# Patient Record
Sex: Male | Born: 1965
Health system: Southern US, Community
[De-identification: ages and names within clinical notes are randomized; demographics above are authoritative.]

## PROBLEM LIST (undated history)

## (undated) DIAGNOSIS — I219 Acute myocardial infarction, unspecified: Secondary | ICD-10-CM

## (undated) HISTORY — DX: Acute myocardial infarction, unspecified: I21.9

---

## 2007-02-23 DIAGNOSIS — I219 Acute myocardial infarction, unspecified: Secondary | ICD-10-CM

## 2007-02-23 HISTORY — DX: Acute myocardial infarction, unspecified: I21.9

## 2010-07-05 ENCOUNTER — Encounter: Admission: RE | Admit: 2010-07-05 | Discharge: 2010-07-05 | Payer: Self-pay | Admitting: Sports Medicine

## 2011-03-04 ENCOUNTER — Other Ambulatory Visit: Payer: Self-pay | Admitting: Specialist

## 2011-03-04 DIAGNOSIS — S43439A Superior glenoid labrum lesion of unspecified shoulder, initial encounter: Secondary | ICD-10-CM

## 2011-03-14 ENCOUNTER — Ambulatory Visit
Admission: RE | Admit: 2011-03-14 | Discharge: 2011-03-14 | Disposition: A | Discharge status: Home / Self Care | Payer: BC Managed Care – PPO | Source: Ambulatory Visit | Attending: Specialist | Admitting: Specialist

## 2011-03-14 DIAGNOSIS — S43439A Superior glenoid labrum lesion of unspecified shoulder, initial encounter: Secondary | ICD-10-CM

## 2012-08-12 IMAGING — RF DG FLUORO GUIDE NDL PLC/BX
2 series · 2 of 2 positions shown · non-contrast
Comparison: None.

CLINICAL DATA: Right shoulder pain

FLUOROSCOPICALLY GUIDED NEEDLE PLACEMENT FOR MR ARTHROGRAPHY, RIGHT
SHOULDER

[Series 1: (hospital) · 1 of 1 slices shown]
[im 1/1]
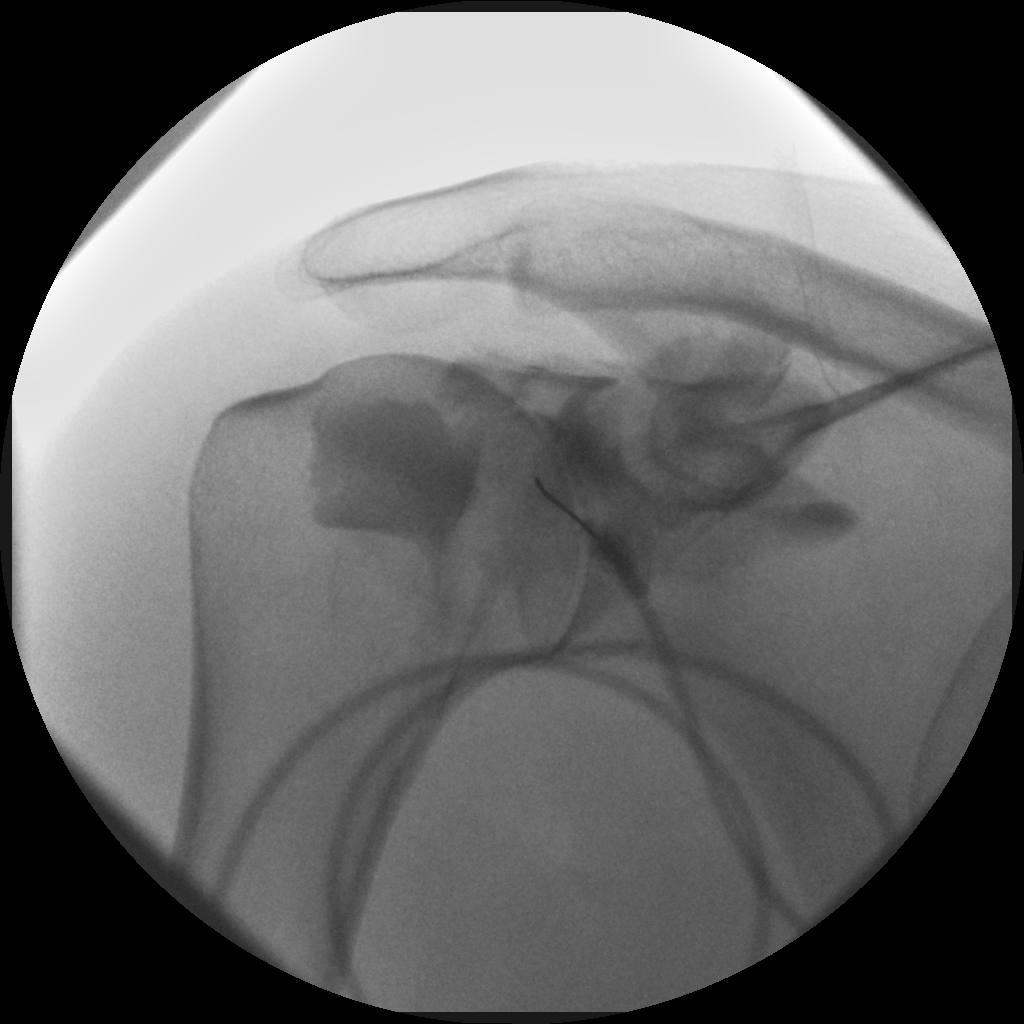

[Series 2: arthrogram · 1 of 1 slices shown]
[im 1/1]
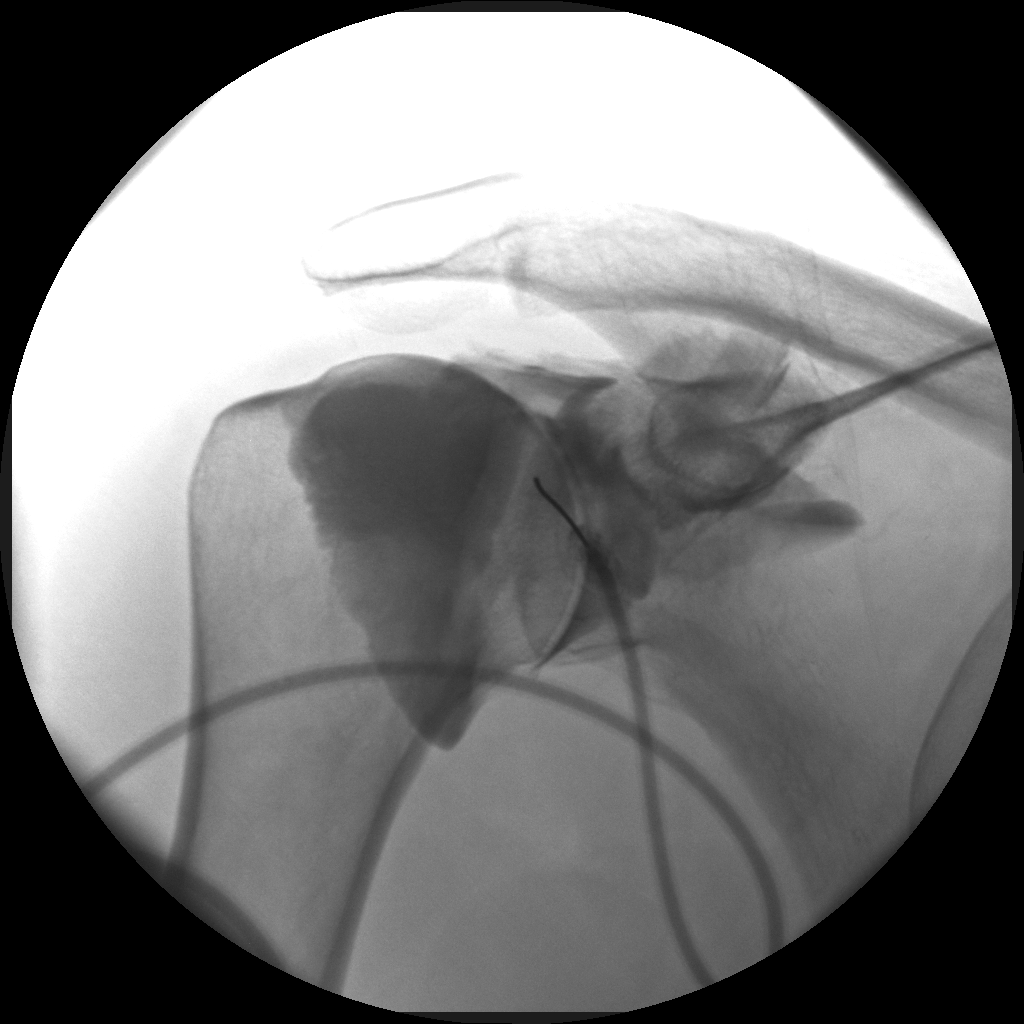

[2 of 2 positions shown; findings below may reference images not displayed]

FINDINGS: Informed, written consent, including pain, infection, and
bleeding, was obtained from the patient before proceeding.  A
thorough Betadine scrub was performed.  An appropriate needle entry
point was chosen, and the skin and subcutaneous tissues were
infiltrated with 1% lidocaine.

A 22 gauge spinal needle was placed directly on the superomedial
aspect of the humeral head, and entry into the glenohumeral joint
was confirmed using "loss of resistance" technique, with additional
lidocaine.

A [DATE] dilution of Gadolinium (0.1 mL MultiHance), mixed with 5 ml
of saline, 10 ml of Karlios, and 5 ml of 1% lidocaine, was injected
into the glenohumeral joint under fluoroscopic guidance.  There was
good opacification of the joint.

The study was performed not as a diagnostic arthrogram, but as a
prelude to MR arthrography.

The needle was withdrawn, a sterile bandage was applied, and the
patient was escorted to the MR suite.  There were no immediate
complications.
IMPRESSION: Technically successful glenohumeral joint instillation of dilute
MultiHance as described above for MR arthrography, right shoulder.

## 2013-11-24 ENCOUNTER — Other Ambulatory Visit: Payer: Self-pay | Admitting: *Deleted

## 2013-11-24 DIAGNOSIS — E7800 Pure hypercholesterolemia, unspecified: Secondary | ICD-10-CM

## 2013-11-24 DIAGNOSIS — Z79899 Other long term (current) drug therapy: Secondary | ICD-10-CM

## 2013-12-14 ENCOUNTER — Other Ambulatory Visit (INDEPENDENT_AMBULATORY_CARE_PROVIDER_SITE_OTHER): Payer: 59

## 2013-12-14 ENCOUNTER — Encounter: Payer: Self-pay | Admitting: *Deleted

## 2013-12-14 DIAGNOSIS — E7800 Pure hypercholesterolemia, unspecified: Secondary | ICD-10-CM

## 2013-12-14 DIAGNOSIS — Z79899 Other long term (current) drug therapy: Secondary | ICD-10-CM

## 2013-12-14 LAB — HEPATIC FUNCTION PANEL
ALT: 23 U/L (ref 0–53)
AST: 23 U/L (ref 0–37)
Albumin: 4.2 g/dL (ref 3.5–5.2)
Alkaline Phosphatase: 58 U/L (ref 39–117)
Bilirubin, Direct: 0.2 mg/dL (ref 0.0–0.3)
TOTAL PROTEIN: 7.3 g/dL (ref 6.0–8.3)
Total Bilirubin: 1.5 mg/dL — ABNORMAL HIGH (ref 0.3–1.2)

## 2013-12-14 LAB — LIPID PANEL
CHOL/HDL RATIO: 3
Cholesterol: 112 mg/dL (ref 0–200)
HDL: 39.4 mg/dL (ref >39.00)
LDL Cholesterol: 63 mg/dL (ref 0–99)
TRIGLYCERIDES: 49 mg/dL (ref 0.0–149.0)
VLDL: 9.8 mg/dL (ref 0.0–40.0)

## 2013-12-15 ENCOUNTER — Other Ambulatory Visit: Payer: BC Managed Care – PPO

## 2013-12-16 ENCOUNTER — Encounter: Payer: Self-pay | Admitting: Cardiology

## 2013-12-16 ENCOUNTER — Ambulatory Visit (INDEPENDENT_AMBULATORY_CARE_PROVIDER_SITE_OTHER): Payer: 59 | Admitting: Cardiology

## 2013-12-16 ENCOUNTER — Ambulatory Visit: Payer: BC Managed Care – PPO | Admitting: Cardiology

## 2013-12-16 VITALS — BP 130/85 | HR 70 | Ht 70.0 in | Wt 226.0 lb

## 2013-12-16 DIAGNOSIS — E7800 Pure hypercholesterolemia, unspecified: Secondary | ICD-10-CM

## 2013-12-16 DIAGNOSIS — I252 Old myocardial infarction: Secondary | ICD-10-CM

## 2013-12-16 DIAGNOSIS — I2510 Atherosclerotic heart disease of native coronary artery without angina pectoris: Secondary | ICD-10-CM | POA: Insufficient documentation

## 2013-12-16 MED ORDER — ATORVASTATIN CALCIUM 20 MG PO TABS: 20.0000 mg | ORAL_TABLET | Freq: Every day | ORAL | Status: DC

## 2013-12-16 NOTE — Progress Notes (Signed)
      [address] [place], [place]  [address] Phone: [phone] Fax:  [phone]  Date:  12/16/2013   ID:  Andre DingwallRobert Wyatt, DOB [birth year], MRN [id number]  PCP:  No primary provider on file.   History of Present Illness: Andre DingwallRobert Boccio is a 48 y.o. male with prior myocardial infarction of the lateral wall in 2008 with PTCA only, no stent placement here for annual followup. He is compliant with his medications, takes aspirin, low-dose beta blocker, Lipitor. Fish oil. His last LDL in January of 2014 was 60. His prior myocardial infarction felt like pressure in his chest, heartburn-like, this occurred during a workout. He took a shower, felt better than the pressure returned. He ended up driving to the hospital or a small obtuse marginal lesion was noted.   He has had a few times with sex feeling lightheaded. One time at the gym after cardio felt light headed. Slowed down and it went away.  Doing well. No CP. Joint issues with exercise. Overall doing well. LDL cholesterol excellent.    Wt Readings from Last 3 Encounters:  12/16/13 226 lb (102.513 kg)     Past Medical History  Diagnosis Date  . MI (myocardial infarction) 02/23/2007    lateral wall MI-[facility]-balloon angioplasty only to small distal obtuse marginal branch. No stent placed.    No past surgical history on file.0  Current Outpatient Prescriptions  Medication Sig Dispense Refill  . aspirin 81 MG tablet Take 81 mg by mouth daily.      Marland Kitchen. atorvastatin (LIPITOR) 20 MG tablet Take 20 mg by mouth daily.      . Multiple Vitamin (MULTIVITAMIN) capsule Take 1 capsule by mouth daily.      . nitroGLYCERIN (NITROSTAT) 0.4 MG SL tablet Place 0.4 mg under the tongue every 5 (five) minutes as needed for chest pain.      . Omega-3 Fatty Acids (FISH OIL) 1000 MG CAPS Take by mouth.       No current facility-administered medications for this visit.    Allergies:   No Known Allergies  Social History:  The patient   reports that he has never smoked. He does not have any smokeless tobacco history on file. He reports that he does not drink alcohol or use illicit drugs.   ROS:  Please see the history of present illness.   No angina, no shortness of breath, no syncope    PHYSICAL EXAM: VS:  BP 130/85  Pulse 70  Ht 5\' 10"  (1.778 m)  Wt 226 lb (102.513 kg)  BMI 32.43 kg/m2 Well nourished, well developed, in no acute distress HEENT: normal Neck: no JVD Cardiac:  normal S1, S2; RRR; no murmur Lungs:  clear to auscultation bilaterally, no wheezing, rhonchi or rales Abd: soft, nontender, no hepatomegaly Ext: no edema Skin: warm and dry Neuro: no focal abnormalities noted  EKG:  None today    ASSESSMENT AND PLAN:  1. Coronary artery disease-status post PTCA only to obtuse marginal branch in 2008 during infarction. No stent placement doing well. 2. Hyperlipidemia-continue with current statin regimen. Recheck lipid panel in one year.  Signed, Donato SchultzMark Shaquina Gillham, MD [facility]  12/16/2013 2:32 PM

## 2013-12-16 NOTE — Patient Instructions (Signed)
Your physician wants you to follow-up in: 1 YEAR with Dr. Anne FuSkains. You will receive a reminder letter in the mail two months in advance. If you don't receive a letter, please call our office to schedule the follow-up appointment.  Your physician recommends that you continue on your current medications as directed. Please refer to the Current Medication list given to you today.  Your physician recommends that you return for lab work FASTING on 12/15/14.   Refilled your atorvastatin to [facility] for a 90 day supply with 3 refills.

## 2014-09-24 ENCOUNTER — Other Ambulatory Visit: Payer: Self-pay | Admitting: Cardiology

## 2014-12-15 ENCOUNTER — Other Ambulatory Visit: Payer: 59

## 2014-12-22 ENCOUNTER — Other Ambulatory Visit (INDEPENDENT_AMBULATORY_CARE_PROVIDER_SITE_OTHER): Payer: BLUE CROSS/BLUE SHIELD | Admitting: *Deleted

## 2014-12-22 DIAGNOSIS — E7800 Pure hypercholesterolemia, unspecified: Secondary | ICD-10-CM

## 2014-12-22 DIAGNOSIS — I252 Old myocardial infarction: Secondary | ICD-10-CM

## 2014-12-22 LAB — HEPATIC FUNCTION PANEL
ALBUMIN: 4.1 g/dL (ref 3.5–5.2)
ALK PHOS: 60 U/L (ref 39–117)
ALT: 18 U/L (ref 0–53)
AST: 18 U/L (ref 0–37)
BILIRUBIN DIRECT: 0.2 mg/dL (ref 0.0–0.3)
BILIRUBIN TOTAL: 0.9 mg/dL (ref 0.2–1.2)
TOTAL PROTEIN: 6.7 g/dL (ref 6.0–8.3)

## 2014-12-22 LAB — LIPID PANEL
CHOLESTEROL: 107 mg/dL (ref 0–200)
HDL: 45.2 mg/dL (ref >39.00)
LDL CALC: 49 mg/dL (ref 0–99)
NONHDL: 61.8
Total CHOL/HDL Ratio: 2
Triglycerides: 65 mg/dL (ref 0.0–149.0)
VLDL: 13 mg/dL (ref 0.0–40.0)

## 2014-12-22 NOTE — Addendum Note (Signed)
Addended by: Tonita PhoenixBOWDEN, Fryda Molenda K on: 12/22/2014 07:52 AM   Modules accepted: Orders

## 2014-12-22 NOTE — Addendum Note (Signed)
Addended by: Kevin Space K on: 12/22/2014 07:52 AM   Modules accepted: Orders  

## 2015-01-02 ENCOUNTER — Telehealth: Payer: Self-pay | Admitting: Cardiology

## 2015-01-02 NOTE — Telephone Encounter (Signed)
Returned call and reviewed l/l with pt who states understanding

## 2015-01-02 NOTE — Telephone Encounter (Signed)
Follow Up         Pt returning phone call to get lab results. Please call back.

## 2015-01-04 ENCOUNTER — Encounter: Payer: Self-pay | Admitting: Cardiology

## 2015-01-04 ENCOUNTER — Ambulatory Visit (INDEPENDENT_AMBULATORY_CARE_PROVIDER_SITE_OTHER): Payer: BLUE CROSS/BLUE SHIELD | Admitting: Cardiology

## 2015-01-04 VITALS — BP 115/88 | HR 71 | Ht 70.0 in | Wt 233.8 lb

## 2015-01-04 DIAGNOSIS — I2510 Atherosclerotic heart disease of native coronary artery without angina pectoris: Secondary | ICD-10-CM

## 2015-01-04 DIAGNOSIS — E7800 Pure hypercholesterolemia, unspecified: Secondary | ICD-10-CM

## 2015-01-04 DIAGNOSIS — I252 Old myocardial infarction: Secondary | ICD-10-CM

## 2015-01-04 MED ORDER — ATORVASTATIN CALCIUM 20 MG PO TABS: ORAL_TABLET | ORAL | Status: DC

## 2015-01-04 NOTE — Progress Notes (Signed)
[address] [place], [place]  [address] Phone: [phone] Fax:  [phone]  Date:  01/04/2015   ID:  Andre Wyatt, DOB 09-05-1966, MRN [id number]  PCP:  No primary care provider on file.   History of Present Illness: Andre Wyatt is a 49 y.o. male with prior myocardial infarction of the lateral wall in 2008 with PTCA only, no stent placement here for annual followup. He is compliant with his medications, takes aspirin, low-dose beta blocker, Lipitor. Fish oil. His last LDL in January of 2014 was 60. His prior myocardial infarction felt like pressure in his chest, heartburn-like, this occurred during a workout. He took a shower, felt better than the pressure returned. He ended up driving to the hospital or a small obtuse marginal lesion was noted.   He has had a few times with sex feeling lightheaded. One time at the gym after cardio felt light headed. Slowed down and it went away.  Doing well. No CP. Joint issues with exercise. Overall doing well. LDL cholesterol excellent. He is had knee surgery as well as right shoulder surgery. He is currently in physical therapy for his left shoulder.  Currently, his mother is battling with dementia. Lives with him.    Wt Readings from Last 3 Encounters:  01/04/15 233 lb 12.8 oz (106.051 kg)  12/16/13 226 lb (102.513 kg)     Past Medical History  Diagnosis Date  . MI (myocardial infarction) 02/23/2007    lateral wall MI-[facility]-balloon angioplasty only to small distal obtuse marginal branch. No stent placed.    No past surgical history on file.0  Current Outpatient Prescriptions  Medication Sig Dispense Refill  . aspirin 81 MG tablet Take 81 mg by mouth daily.    Marland Kitchen atorvastatin (LIPITOR) 20 MG tablet Take 1 tablet by mouth  daily 90 tablet 0  . Multiple Vitamin (MULTIVITAMIN) capsule Take 1 capsule by mouth daily.    . nitroGLYCERIN (NITROSTAT) 0.4 MG SL tablet Place 0.4 mg under the tongue every 5 (five)  minutes as needed for chest pain.    . Omega 3 1000 MG CAPS Take 1 capsule by mouth daily.     No current facility-administered medications for this visit.    Allergies:   No Known Allergies  Social History:  The patient  reports that he has never smoked. He does not have any smokeless tobacco history on file. He reports that he does not drink alcohol or use illicit drugs.   ROS:  Please see the history of present illness.   No angina, no shortness of breath, no syncope    PHYSICAL EXAM: VS:  BP 115/88 mmHg  Pulse 71  Ht  (1.778 m)  Wt 233 lb 12.8 oz (106.051 kg)  BMI 33.55 kg/m2 Well nourished, well developed, in no acute distress HEENT: normal Neck: no JVD Cardiac:  normal S1, S2; RRR; no murmur Lungs:  clear to auscultation bilaterally, no wheezing, rhonchi or rales Abd: soft, nontender, no hepatomegaly Ext: no edema Skin: warm and dry Neuro: no focal abnormalities noted  EKG:  01/04/15-sinus rhythm, 72, no other abnormalities.   Labs: 12/22/14-LDL 49, trig 65, excellent. ALT 18.   ASSESSMENT AND PLAN:  1. Coronary artery disease-status post PTCA only to obtuse marginal branch in 2008 during infarction. No stent placement doing well. No anginal symptoms. Exercising. He has gained some weight. Working on this. 2. Hyperlipidemia-continue with current statin regimen. Recheck lipid panel in one year.  3. One-year follow-up  Signed, Donato SchultzMark Taiana Temkin, MD [facility]  01/04/2015 9:11 AM

## 2015-01-04 NOTE — Patient Instructions (Signed)
The current medical regimen is effective;  continue present plan and medications.  Follow up in 1 year with Dr. Skains.  You will receive a letter in the mail 2 months before you are due.  Please call us when you receive this letter to schedule your follow up appointment.  Thank you for choosing [facility]!!     

## 2015-01-17 ENCOUNTER — Other Ambulatory Visit: Payer: Self-pay | Admitting: Cardiology

## 2015-11-28 ENCOUNTER — Telehealth: Payer: Self-pay | Admitting: Cardiology

## 2015-11-28 DIAGNOSIS — Z79899 Other long term (current) drug therapy: Secondary | ICD-10-CM

## 2015-11-28 DIAGNOSIS — E7800 Pure hypercholesterolemia, unspecified: Secondary | ICD-10-CM

## 2015-11-28 NOTE — Telephone Encounter (Signed)
New  Message   Pt will like to come in for fasting labs before 2/14

## 2015-11-28 NOTE — Telephone Encounter (Signed)
Will ask Dr Anne FuSkains what labs he would like drawn on patient and call him back

## 2015-11-28 NOTE — Telephone Encounter (Signed)
Lipids and ALT. Thanks.  SKAINS, MARK, MD  

## 2015-11-29 NOTE — Telephone Encounter (Signed)
Pt aware.  Lab order and scheduled.

## 2016-01-03 ENCOUNTER — Other Ambulatory Visit (INDEPENDENT_AMBULATORY_CARE_PROVIDER_SITE_OTHER): Payer: BLUE CROSS/BLUE SHIELD | Admitting: *Deleted

## 2016-01-03 DIAGNOSIS — E7800 Pure hypercholesterolemia, unspecified: Secondary | ICD-10-CM

## 2016-01-03 DIAGNOSIS — Z79899 Other long term (current) drug therapy: Secondary | ICD-10-CM

## 2016-01-03 LAB — LIPID PANEL
CHOL/HDL RATIO: 3.2 ratio (ref <=5.0)
CHOLESTEROL: 108 mg/dL — AB (ref 125–200)
HDL: 34 mg/dL — ABNORMAL LOW (ref >=40)
LDL Cholesterol: 55 mg/dL (ref <130)
Triglycerides: 93 mg/dL (ref <150)
VLDL: 19 mg/dL (ref <30)

## 2016-01-03 LAB — ALT: ALT: 24 U/L (ref 9–46)

## 2016-01-08 ENCOUNTER — Encounter: Payer: Self-pay | Admitting: Cardiology

## 2016-01-08 ENCOUNTER — Ambulatory Visit (INDEPENDENT_AMBULATORY_CARE_PROVIDER_SITE_OTHER): Payer: BLUE CROSS/BLUE SHIELD | Admitting: Cardiology

## 2016-01-08 VITALS — BP 118/84 | HR 64 | Ht 70.0 in | Wt 235.0 lb

## 2016-01-08 DIAGNOSIS — E7800 Pure hypercholesterolemia, unspecified: Secondary | ICD-10-CM

## 2016-01-08 DIAGNOSIS — I252 Old myocardial infarction: Secondary | ICD-10-CM

## 2016-01-08 DIAGNOSIS — I2510 Atherosclerotic heart disease of native coronary artery without angina pectoris: Secondary | ICD-10-CM

## 2016-01-08 NOTE — Patient Instructions (Signed)
Medication Instructions:  Your physician recommends that you continue on your current medications as directed. Please refer to the Current Medication list given to you today.  Labwork: None ordered  Testing/Procedures: None ordered   Follow-Up: Your physician wants you to follow-up in: 1 year with Dr. Skains. You will receive a reminder letter in the mail two months in advance. If you don't receive a letter, please call our office to schedule the follow-up appointment.  Any Other Special Instructions Will Be Listed Below (If Applicable).     If you need a refill on your cardiac medications before your next appointment, please call your pharmacy.  

## 2016-01-08 NOTE — Progress Notes (Signed)
[address] [place], [place]  [address] Phone: [phone] Fax:  [phone]  Date:  01/08/2016   ID:  Andre Wyatt, DOB Nov 18, 1966, MRN [id number]  PCP:  Farris Has, MD   History of Present Illness: Andre Wyatt is a 50 y.o. male with prior myocardial infarction of the lateral wall in 2008 with PTCA only, no stent placement here for annual followup. He is compliant with his medications, takes aspirin, low-dose beta blocker, Lipitor. Fish oil. His last LDL in January of 2014 was 60. His prior myocardial infarction felt like pressure in his chest, heartburn-like, this occurred during a workout. He took a shower, felt better than the pressure returned. He ended up driving to the hospital or a small obtuse marginal lesion was noted.   He has had a few times with sex feeling lightheaded. One time at the gym after cardio felt light headed. Slowed down and it went away.  Doing well. No CP. Joint issues with exercise. Overall doing well. LDL cholesterol excellent. He is had knee surgery as well as right shoulder surgery. He is currently in physical therapy for his left shoulder.  Currently, his mother is battling with dementia. Lives with him.    Wt Readings from Last 3 Encounters:  01/08/16 235 lb (106.595 kg)  01/04/15 233 lb 12.8 oz (106.051 kg)  12/16/13 226 lb (102.513 kg)     Past Medical History  Diagnosis Date  . MI (myocardial infarction) ([facility]) 02/23/2007    lateral wall MI-[facility]-balloon angioplasty only to small distal obtuse marginal branch. No stent placed.    No past surgical history on file.0  Current Outpatient Prescriptions  Medication Sig Dispense Refill  . aspirin 81 MG tablet Take 81 mg by mouth daily.    Marland Kitchen atorvastatin (LIPITOR) 20 MG tablet Take 1 tablet by mouth  daily 90 tablet 3  . Multiple Vitamin (MULTIVITAMIN) capsule Take 1 capsule by mouth daily.    . nitroGLYCERIN (NITROSTAT) 0.4 MG SL tablet Place 0.4 mg under the tongue  every 5 (five) minutes as needed for chest pain.    . Omega 3 1000 MG CAPS Take 2 capsules by mouth daily.      No current facility-administered medications for this visit.    Allergies:   No Known Allergies  Social History:  The patient  reports that he has never smoked. He does not have any smokeless tobacco history on file. He reports that he does not drink alcohol or use illicit drugs.   ROS:  Please see the history of present illness.   No angina, no shortness of breath, no syncope    PHYSICAL EXAM: VS:  BP 118/84 mmHg  Pulse 64  Ht  (1.778 m)  Wt 235 lb (106.595 kg)  BMI 33.72 kg/m2 Well nourished, well developed, in no acute distress HEENT: normal Neck: no JVD Cardiac:  normal S1, S2; RRR; no murmur Lungs:  clear to auscultation bilaterally, no wheezing, rhonchi or rales Abd: soft, nontender, no hepatomegaly Ext: no edema Skin: warm and dry Neuro: no focal abnormalities noted  EKG:  01/04/15-sinus rhythm, 72, no other abnormalities.   Labs: 12/22/14-LDL 49, trig 65, excellent. ALT 18.   ASSESSMENT AND PLAN:  1. Coronary artery disease-status post PTCA only to obtuse marginal branch in 2008 during infarction. No stent placement doing well. No anginal symptoms. Exercising. Overall doing well, stressed at work, regulatory issues with pain frame. 2. Hyperlipidemia-continue with current statin regimen.  Recheck lipid panel in one year. 3. One-year follow-up  Signed, Donato Schultz, MD [facility]  01/08/2016 4:23 PM

## 2016-04-02 ENCOUNTER — Other Ambulatory Visit: Payer: Self-pay | Admitting: *Deleted

## 2016-04-02 MED ORDER — ATORVASTATIN CALCIUM 20 MG PO TABS: ORAL_TABLET | ORAL | Status: DC

## 2016-11-27 ENCOUNTER — Other Ambulatory Visit: Payer: Self-pay | Admitting: Cardiology

## 2017-01-09 ENCOUNTER — Encounter: Payer: Self-pay | Admitting: *Deleted

## 2017-01-16 ENCOUNTER — Encounter: Payer: Self-pay | Admitting: Cardiology

## 2017-01-19 ENCOUNTER — Encounter: Payer: Self-pay | Admitting: Cardiology

## 2017-01-19 ENCOUNTER — Ambulatory Visit (INDEPENDENT_AMBULATORY_CARE_PROVIDER_SITE_OTHER): Payer: BLUE CROSS/BLUE SHIELD | Admitting: Cardiology

## 2017-01-19 VITALS — BP 110/80 | HR 70 | Ht 70.0 in | Wt 240.6 lb

## 2017-01-19 DIAGNOSIS — I252 Old myocardial infarction: Secondary | ICD-10-CM

## 2017-01-19 DIAGNOSIS — E7800 Pure hypercholesterolemia, unspecified: Secondary | ICD-10-CM

## 2017-01-19 DIAGNOSIS — E6609 Other obesity due to excess calories: Secondary | ICD-10-CM | POA: Diagnosis not present

## 2017-01-19 DIAGNOSIS — I2510 Atherosclerotic heart disease of native coronary artery without angina pectoris: Secondary | ICD-10-CM | POA: Diagnosis not present

## 2017-01-19 NOTE — Progress Notes (Signed)
[address] [place], [place]  [address] Phone: [phone] Fax:  [phone]  Date:  01/19/2017   ID:  Faythe DingwallRobert Janney, DOB Jul 21, 1966, MRN [id number]  PCP:  Farris HasMORROW, AARON, MD   History of Present Illness: Faythe DingwallRobert Welke is a 51 y.o. male with prior myocardial infarction of the lateral wall in 2008 with PTCA only, no stent placement here for followup.   He is compliant with his medications, takes aspirin, Lipitor. Fish oil. His prior myocardial infarction felt like pressure in his chest, heartburn-like, this occurred during a workout. He took a shower, felt better than the pressure returned. He ended up driving to the hospital or a small obtuse marginal lesion was noted.   One time at the gym after cardio felt light headed. Slowed down and it went away.  Doing well. No CP. Joint issues with exercise. Overall doing well. He and his wife were working on weight loss. LDL cholesterol excellent. He is had knee surgery as well as right shoulder surgery.  His mother is battling with dementia. Lives with him.  He says that work has been very challenging, busy.    Wt Readings from Last 3 Encounters:  01/19/17 240 lb 9.6 oz (109.1 kg)  01/08/16 235 lb (106.6 kg)  01/04/15 233 lb 12.8 oz (106.1 kg)     Past Medical History:  Diagnosis Date  . MI (myocardial infarction) 02/23/2007   lateral wall MI-[facility]-balloon angioplasty only to small distal obtuse marginal branch. No stent placed.    No past surgical history on file.0  Current Outpatient Prescriptions  Medication Sig Dispense Refill  . aspirin 81 MG tablet Take 81 mg by mouth daily.    Marland Kitchen. atorvastatin (LIPITOR) 20 MG tablet Take 1 tablet (20 mg total) by mouth daily. 90 tablet 0  . Multiple Vitamin (MULTIVITAMIN) capsule Take 1 capsule by mouth daily.    . nitroGLYCERIN (NITROSTAT) 0.4 MG SL tablet Place 0.4 mg under the tongue every 5 (five) minutes as needed for chest pain.    . Omega 3 1000 MG CAPS Take  2 capsules by mouth daily.      No current facility-administered medications for this visit.     Allergies:   No Known Allergies  Social History:  The patient  reports that he has never smoked. He has never used smokeless tobacco. He reports that he does not drink alcohol or use drugs.   ROS:  Please see the history of present illness.   No angina, no shortness of breath, no syncope    PHYSICAL EXAM: VS:  BP 110/80   Pulse 70   Ht 5\' 10"  (1.778 m)   Wt 240 lb 9.6 oz (109.1 kg)   BMI 34.52 kg/m  Well nourished, well developed, in no acute distress  HEENT: normal  Neck: no JVD  Cardiac:  normal S1, S2; RRR; no murmur  Lungs:  clear to auscultation bilaterally, no wheezing, rhonchi or rales  Abd: soft, nontender, no hepatomegaly  Ext: no edema  Skin: warm and dry  Neuro: no focal abnormalities noted  EKG:  EKG ordered today 01/19/17-sinus rhythm 70 with no other abnormalities personally viewed-prior 01/04/15-sinus rhythm, 72, no other abnormalities.   Labs: 01/03/16-ALT 24, LDL 55, 10/24/16 - LDL 64, HDL 41  ASSESSMENT AND PLAN:  1. Coronary artery disease-status post PTCA only to obtuse marginal branch in 2008 during infarction. No stent placement doing well. No anginal symptoms. Exercising. Overall doing well, stressed  at work. He is no longer on beta blocker. Blood pressure has been low. It is been several years since his coronary incident. 2. Hyperlipidemia-continue with current statin regimen. Excellent control of his LDL, less than 70. 3. Obesity-continue to encourage weight loss. He and his wife are going to work on this. 4. Two-year follow-up  Signed, Donato Schultz, MD [facility]  01/19/2017 9:48 AM

## 2017-01-19 NOTE — Patient Instructions (Signed)
Medication Instructions:  The current medical regimen is effective;  continue present plan and medications.  Follow-Up: Follow up in 2 years with Dr. Skains.  You will receive a letter in the mail 2 months before you are due.  Please call us when you receive this letter to schedule your follow up appointment.  If you need a refill on your cardiac medications before your next appointment, please call your pharmacy.  Thank you for choosing [facility]!!     

## 2017-03-17 ENCOUNTER — Other Ambulatory Visit: Payer: Self-pay | Admitting: Cardiology

## 2017-09-11 DIAGNOSIS — F413 Other mixed anxiety disorders: Secondary | ICD-10-CM | POA: Diagnosis not present

## 2017-10-02 DIAGNOSIS — F413 Other mixed anxiety disorders: Secondary | ICD-10-CM | POA: Diagnosis not present

## 2017-10-30 DIAGNOSIS — E785 Hyperlipidemia, unspecified: Secondary | ICD-10-CM | POA: Diagnosis not present

## 2017-10-30 DIAGNOSIS — Z0000 Encounter for general adult medical examination without abnormal findings: Secondary | ICD-10-CM | POA: Diagnosis not present

## 2017-10-30 DIAGNOSIS — Z125 Encounter for screening for malignant neoplasm of prostate: Secondary | ICD-10-CM | POA: Diagnosis not present

## 2017-11-21 ENCOUNTER — Other Ambulatory Visit: Payer: Self-pay | Admitting: Cardiology

## 2018-02-03 DIAGNOSIS — F329 Major depressive disorder, single episode, unspecified: Secondary | ICD-10-CM | POA: Diagnosis not present

## 2018-03-11 DIAGNOSIS — F329 Major depressive disorder, single episode, unspecified: Secondary | ICD-10-CM | POA: Diagnosis not present

## 2018-04-12 DIAGNOSIS — F329 Major depressive disorder, single episode, unspecified: Secondary | ICD-10-CM | POA: Diagnosis not present

## 2018-05-14 DIAGNOSIS — F329 Major depressive disorder, single episode, unspecified: Secondary | ICD-10-CM | POA: Diagnosis not present

## 2018-06-18 DIAGNOSIS — F329 Major depressive disorder, single episode, unspecified: Secondary | ICD-10-CM | POA: Diagnosis not present

## 2018-08-20 DIAGNOSIS — F329 Major depressive disorder, single episode, unspecified: Secondary | ICD-10-CM | POA: Diagnosis not present

## 2018-11-12 DIAGNOSIS — Z125 Encounter for screening for malignant neoplasm of prostate: Secondary | ICD-10-CM | POA: Diagnosis not present

## 2018-11-12 DIAGNOSIS — I252 Old myocardial infarction: Secondary | ICD-10-CM | POA: Diagnosis not present

## 2018-11-12 DIAGNOSIS — Z0000 Encounter for general adult medical examination without abnormal findings: Secondary | ICD-10-CM | POA: Diagnosis not present

## 2018-11-12 DIAGNOSIS — Z23 Encounter for immunization: Secondary | ICD-10-CM | POA: Diagnosis not present

## 2018-11-12 DIAGNOSIS — E785 Hyperlipidemia, unspecified: Secondary | ICD-10-CM | POA: Diagnosis not present

## 2019-01-18 DIAGNOSIS — Z23 Encounter for immunization: Secondary | ICD-10-CM | POA: Diagnosis not present

## 2019-03-14 ENCOUNTER — Telehealth: Payer: Self-pay | Admitting: *Deleted

## 2019-03-14 NOTE — Telephone Encounter (Signed)
   Called to reschedule pt for virtual visit.  Pt denies having any active cardiac s/s and would prefer to wait to be seen in the office this fall.  appt rescheduled as requested by patient.  He will c/b if any concerns arise prior to appt.  Cardiac Questionnaire:    Since your last visit or hospitalization:    1. Have you been having new or worsening chest pain? no   2. Have you been having new or worsening shortness of breath? no 3. Have you been having new or worsening leg swelling, wt gain, or increase in abdominal girth (pants fitting more tightly)? no   4. Have you had any passing out spells? no    *A YES to any of these questions would result in the appointment being kept. *If all the answers to these questions are NO, we should indicate that given the current situation regarding the worldwide coronarvirus pandemic, at the recommendation of the CDC, we are looking to limit gatherings in our waiting area, and thus will reschedule their appointment beyond four weeks from today.   _____________   COVID-19 Pre-Screening Questions:  . Do you currently have a fever? no (yes = cancel and refer to pcp for e-visit) . Have you recently travelled on a cruise, internationally, or to [place], [place], [place], [place], [place], or [place], [place] [employer])  no  (yes = cancel, stay home, monitor symptoms, and contact pcp or initiate e-visit if symptoms develop) . Have you been in contact with someone that is currently pending confirmation of Covid19 testing or has been confirmed to have the Covid19 virus?  No  (yes = cancel, stay home, away from tested individual, monitor symptoms, and contact pcp or initiate e-visit if symptoms develop) . Are you currently experiencing fatigue or cough?  No (yes = pt should be prepared to have a mask placed at the time of their visit).

## 2019-03-18 ENCOUNTER — Ambulatory Visit: Payer: BLUE CROSS/BLUE SHIELD | Admitting: Cardiology

## 2019-07-27 ENCOUNTER — Ambulatory Visit: Payer: BLUE CROSS/BLUE SHIELD | Admitting: Cardiology

## 2019-09-02 ENCOUNTER — Telehealth: Payer: BLUE CROSS/BLUE SHIELD | Admitting: Cardiology

## 2019-12-02 DIAGNOSIS — Z125 Encounter for screening for malignant neoplasm of prostate: Secondary | ICD-10-CM | POA: Diagnosis not present

## 2019-12-02 DIAGNOSIS — Z0000 Encounter for general adult medical examination without abnormal findings: Secondary | ICD-10-CM | POA: Diagnosis not present

## 2019-12-02 DIAGNOSIS — E785 Hyperlipidemia, unspecified: Secondary | ICD-10-CM | POA: Diagnosis not present

## 2019-12-16 DIAGNOSIS — Z23 Encounter for immunization: Secondary | ICD-10-CM | POA: Diagnosis not present

## 2020-01-02 ENCOUNTER — Ambulatory Visit (INDEPENDENT_AMBULATORY_CARE_PROVIDER_SITE_OTHER): Payer: BLUE CROSS/BLUE SHIELD | Admitting: Cardiology

## 2020-01-02 ENCOUNTER — Other Ambulatory Visit: Payer: Self-pay

## 2020-01-02 ENCOUNTER — Encounter: Payer: Self-pay | Admitting: Cardiology

## 2020-01-02 VITALS — BP 110/74 | HR 65 | Ht 70.0 in | Wt 243.0 lb

## 2020-01-02 DIAGNOSIS — I2510 Atherosclerotic heart disease of native coronary artery without angina pectoris: Secondary | ICD-10-CM | POA: Diagnosis not present

## 2020-01-02 DIAGNOSIS — E7800 Pure hypercholesterolemia, unspecified: Secondary | ICD-10-CM | POA: Diagnosis not present

## 2020-01-02 NOTE — Patient Instructions (Signed)
Medication Instructions:  The current medical regimen is effective;  continue present plan and medications.  *If you need a refill on your cardiac medications before your next appointment, please call your pharmacy*  Follow-Up: At [facility], you and your health needs are our priority.  As part of our continuing mission to provide you with exceptional heart care, we have created designated Provider Care Teams.  These Care Teams include your primary Cardiologist (physician) and Advanced Practice Providers (APPs -  Physician Assistants and Nurse Practitioners) who all work together to provide you with the care you need, when you need it.  Your next appointment:   2 year(s)  The format for your next appointment:   In Person  Provider:   Mark Skains, MD  Thank you for choosing [facility]!!     

## 2020-01-02 NOTE — Progress Notes (Signed)
[facility]. [address] [place], [place]  [address] Phone: [phone] Fax:  [phone]  Date:  01/02/2020   ID:  Andre Wyatt, DOB 27-Aug-1966, MRN [id number]  PCP:  London Pepper, MD   History of Present Illness: Andre Wyatt is a 54 y.o. male with prior myocardial infarction of the lateral wall in 2008 with PTCA only, no stent placement here for followup.   He is compliant with his medications, takes aspirin, Lipitor. Fish oil. His prior myocardial infarction felt like pressure in his chest, heartburn-like, this occurred during a workout. He took a shower, felt better than the pressure returned. He ended up driving to the hospital or a small obtuse marginal lesion was noted.   One time at the gym after cardio felt light headed. Slowed down and it went away.  Doing well. No CP. Joint issues with exercise. Overall doing well. He and his wife were working on weight loss. LDL cholesterol excellent. He is had knee surgery as well as right shoulder surgery.  His mother is battling with dementia. Lives with him.  He says that work has been very challenging, busy.  01/02/2020-here for the follow-up of prior myocardial infarction of the lateral wall 2008 with PTCA only.  No stent was placed.  Overall seems to be doing quite well with his current medications.  Still struggles a little with weight.  Denies any fevers chills nausea vomiting syncope bleeding.  Working mostly from home.  Mother-in-law Covid recovered.   Wt Readings from Last 3 Encounters:  01/02/20 243 lb (110.2 kg)  01/19/17 240 lb 9.6 oz (109.1 kg)  01/08/16 235 lb (106.6 kg)     Past Medical History:  Diagnosis Date  . MI (myocardial infarction) ([place]) 02/23/2007   lateral wall MI-[facility]-balloon angioplasty only to small distal obtuse marginal branch. No stent placed.    History reviewed. No pertinent surgical history.0  Current Outpatient Medications  Medication Sig Dispense Refill  . aspirin 81 MG  tablet Take 81 mg by mouth daily.    Marland Kitchen atorvastatin (LIPITOR) 20 MG tablet TAKE 1 TABLET DAILY. PLEASECALL [phone] TO       SCHEDULE AN APPOINTMENT FORADDITIONAL REFILLS 30 tablet 0  . Multiple Vitamin (MULTIVITAMIN) capsule Take 1 capsule by mouth daily.    . nitroGLYCERIN (NITROSTAT) 0.4 MG SL tablet Place 0.4 mg under the tongue every 5 (five) minutes as needed for chest pain.    . Omega 3 1000 MG CAPS Take 2 capsules by mouth daily.      No current facility-administered medications for this visit.    Allergies:   No Known Allergies  Social History:  The patient  reports that he has never smoked. He has never used smokeless tobacco. He reports that he does not drink alcohol or use drugs.   ROS:  Please see the history of present illness.       PHYSICAL EXAM: VS:  BP 110/74   Pulse 65   Ht 5\' 10"  (1.778 m)   Wt 243 lb (110.2 kg)   SpO2 95%   BMI 34.87 kg/m  GEN: Well nourished, well developed, in no acute distress, overweight but strong build HEENT: normal  Neck: no JVD, carotid bruits, or masses Cardiac: RRR; no murmurs, rubs, or gallops,no edema  Respiratory:  clear to auscultation bilaterally, normal work of breathing GI: soft, nontender, nondistended, + BS MS: no deformity or atrophy  Skin: warm and dry, no rash Neuro:  Alert and  Oriented x 3, Strength and sensation are intact Psych: euthymic mood, full affect  EKG:  EKG ordered today 01/02/2020- Sinus rhythm 65 T wave inversion in lead III isolated. 01/19/17-sinus rhythm 70 with no other abnormalities personally viewed-prior 01/04/15-sinus rhythm, 72, no other abnormalities.  Labs: 01/03/16-ALT 24, LDL 55, 10/24/16 - LDL 64, HDL 41  ASSESSMENT AND PLAN:    Coronary artery disease -Had a PTCA to the obtuse marginal branch in 2008 during myocardial infarction.  No stent was placed previously.  Not having any anginal symptoms.  Seems to be doing quite well.  No longer requiring beta-blocker.  Blood pressure has been low in  the past.  Hyperlipidemia -LDL at goal less than 70 at last check.  Continue with current regimen.  No myalgias.  Obesity -Continue to encourage weight loss, decrease carbohydrates.  Large muscle mass.  Prior creatinine 1.28 in 2019.  LDL 62 in December 2020 hemoglobin A1c 5.3 ALT 33.  Watch ibuprofen.  He does not take.  Comfortable with 2-year follow-up.    Signed, Donato Schultz, MD [facility]  01/02/2020 9:05 AM

## 2020-02-25 ENCOUNTER — Ambulatory Visit: Payer: BLUE CROSS/BLUE SHIELD | Attending: Internal Medicine

## 2020-02-25 DIAGNOSIS — Z23 Encounter for immunization: Secondary | ICD-10-CM

## 2020-02-25 NOTE — Progress Notes (Signed)
   Covid-19 Vaccination Clinic  Name:  Andre Wyatt    MRN: [id number] DOB: 11-16-1966  02/25/2020  [name] was observed post Covid-19 immunization for 15 minutes without incident. He was provided with Vaccine Information Sheet and instruction to access the V-Safe system.   [name] was instructed to call 911 with any severe reactions post vaccine: Marland Kitchen Difficulty breathing  . Swelling of face and throat  . A fast heartbeat  . A bad rash all over body  . Dizziness and weakness   Immunizations Administered    Name Date Dose VIS Date Route   Pfizer COVID-19 Vaccine 02/25/2020 11:05 AM 0.3 mL 11/04/2019 Intramuscular   Manufacturer: [employer], [employer]   Lot: [id number]   NDC: [address]-4

## 2020-03-21 ENCOUNTER — Ambulatory Visit: Payer: BLUE CROSS/BLUE SHIELD | Attending: Internal Medicine

## 2020-03-21 DIAGNOSIS — Z23 Encounter for immunization: Secondary | ICD-10-CM

## 2020-03-21 NOTE — Progress Notes (Signed)
   Covid-19 Vaccination Clinic  Name:  Andre Wyatt    MRN: [id number] DOB: 1966/02/26  03/21/2020  [name] was observed post Covid-19 immunization for 15 minutes without incident. He was provided with Vaccine Information Sheet and instruction to access the V-Safe system.   [name] was instructed to call 911 with any severe reactions post vaccine: Marland Kitchen Difficulty breathing  . Swelling of face and throat  . A fast heartbeat  . A bad rash all over body  . Dizziness and weakness   Immunizations Administered    Name Date Dose VIS Date Route   Pfizer COVID-19 Vaccine 03/21/2020  8:39 AM 0.3 mL 01/18/2019 Intramuscular   Manufacturer: [employer], [employer]   Lot: [id number]   NDC: [address]-0

## 2022-03-19 ENCOUNTER — Encounter: Payer: Self-pay | Admitting: Cardiology

## 2022-03-19 ENCOUNTER — Ambulatory Visit (INDEPENDENT_AMBULATORY_CARE_PROVIDER_SITE_OTHER): Payer: BLUE CROSS/BLUE SHIELD | Admitting: Cardiology

## 2022-03-19 VITALS — BP 110/80 | HR 69 | Ht 70.0 in | Wt 254.0 lb

## 2022-03-19 DIAGNOSIS — I2510 Atherosclerotic heart disease of native coronary artery without angina pectoris: Secondary | ICD-10-CM

## 2022-03-19 DIAGNOSIS — E7800 Pure hypercholesterolemia, unspecified: Secondary | ICD-10-CM | POA: Diagnosis not present

## 2022-03-19 DIAGNOSIS — I252 Old myocardial infarction: Secondary | ICD-10-CM

## 2022-03-19 NOTE — Progress Notes (Signed)
? ? ? ? ?[address] ?[place], [place]  [address] ?Phone: [phone] ?Fax:  [phone] ? ?Date:  03/19/2022  ? ?ID:  Andre Wyatt, DOB Apr 02, 1966, MRN [id number] ? ?PCP:  London Pepper, MD  ? ?History of Present Illness: ?Andre Wyatt is a 56 y.o. male with prior myocardial infarction of the lateral wall in 2008 with PTCA only, no stent placement here for followup.  Overall doing quite well.  He is not having any chest pain no fevers chills nausea vomiting syncope bleeding.  Knee pain occasionally. ? ?He is compliant with his medications, takes aspirin, Lipitor. Fish oil. His prior myocardial infarction felt like pressure in his chest, heartburn-like, this occurred during a workout. He took a shower, felt better than the pressure returned. He ended up driving to the hospital or a small obtuse marginal lesion was noted.  ? ?One time at the gym after cardio felt light headed. Slowed down and it went away. ? ?Joint issues with exercise. Overall doing well. He and his wife were working on weight loss. LDL cholesterol excellent. He is had knee surgery as well as right shoulder surgery. ? ?His mother is battling with dementia. Lives with him. ? ?He says that work has been very challenging, busy. ? ?01/02/2020-here for the follow-up of prior myocardial infarction of the lateral wall 2008 with PTCA only.  No stent was placed.  Overall seems to be doing quite well with his current medications.  Still struggles a little with weight.  Denies any fevers chills nausea vomiting syncope bleeding.  Working mostly from home.  Mother-in-law Covid recovered. ? ?03/19/2022-doing very well no chest pain no syncope no bleeding.  Discussed medications.  See below for details ? ? ?Wt Readings from Last 3 Encounters:  ?03/19/22 254 lb (115.2 kg)  ?01/02/20 243 lb (110.2 kg)  ?01/19/17 240 lb 9.6 oz (109.1 kg)  ?  ? ?Past Medical History:  ?Diagnosis Date  ? MI (myocardial infarction) ([place]) 02/23/2007  ? lateral wall MI-[facility]-balloon angioplasty only to small distal obtuse marginal branch. No stent placed.  ? ? ?No past surgical history on file.0 ? ?Current Outpatient Medications  ?Medication Sig Dispense Refill  ? aspirin 81 MG tablet Take 81 mg by mouth daily.    ? atorvastatin (LIPITOR) 20 MG tablet TAKE 1 TABLET DAILY. PLEASECALL [phone] TO       SCHEDULE AN APPOINTMENT FORADDITIONAL REFILLS 30 tablet 0  ? Multiple Vitamin (MULTIVITAMIN) capsule Take 1 capsule by mouth daily.    ? nitroGLYCERIN (NITROSTAT) 0.4 MG SL tablet Place 0.4 mg under the tongue every 5 (five) minutes as needed for chest pain.    ? Omega 3 1000 MG CAPS Take 2 capsules by mouth daily.     ? ?No current facility-administered medications for this visit.  ? ? ?Allergies:   No Known Allergies ? ?Social History:  The patient  reports that he has never smoked. He has never used smokeless tobacco. He reports that he does not drink alcohol and does not use drugs.  ? ?ROS:  Please see the history of present illness.      ? ?PHYSICAL EXAM: ?VS:  BP 110/80 (BP Location: Left Arm, Patient Position: Sitting, Cuff Size: Normal)   Pulse 69   Ht 5\' 10"  (1.778 m)   Wt 254 lb (115.2 kg)   BMI 36.45 kg/m?  ?GEN: Well nourished, well developed, in no acute distress, overweight but strong build ?HEENT: normal  ?Neck: no  JVD, carotid bruits, or masses ?Cardiac: RRR; no murmurs, rubs, or gallops,no edema  ?Respiratory:  clear to auscultation bilaterally, normal work of breathing ?GI: soft, nontender, nondistended, + BS ?MS: no deformity or atrophy  ?Skin: warm and dry, no rash ?Neuro:  Alert and Oriented x 3, Strength and sensation are intact ?Psych: euthymic mood, full affect ? ?EKG:  EKG ordered today/26/23-normal sinus rhythm 69 no other changes.  Excellent.  01/02/2020- Sinus rhythm 65 T wave inversion in lead III isolated. ?01/19/17-sinus rhythm 70 with no other abnormalities personally viewed-prior 01/04/15-sinus rhythm, 72, no other abnormalities.  ?Labs:  01/03/16-ALT 24, LDL 55, 10/24/16 - LDL 64, HDL 41 ? ?ASSESSMENT AND PLAN: ? ?Coronary atherosclerosis of native coronary artery ?-Had a PTCA to the obtuse marginal branch in 2008 during myocardial infarction.  No stent was placed previously.  Not having any anginal symptoms.  Seems to be doing quite well.  No longer requiring beta-blocker.  Blood pressure has been low in the past.  Doing very well.  No changes.  Continuing with statin.  He did ask about aspirin.  We will continue. ? ?Old MI (myocardial infarction) ?PTCA to the obtuse marginal branch in 2008 during myocardial infarction.  No stent  ? ?Pure hypercholesterolemia ?-LDL at goal less than 70 at last check.  Continue with current regimen.  No myalgias.  His primary care physician continues to monitor closely.  Doing well.  ? ?Prior creatinine 1.28 in 2019.  LDL 62 in December 2020 hemoglobin A1c 5.3 ALT 33.  Watch ibuprofen.  He does not take. ? ?Comfortable with 2-year follow-up once again. ? ? ? ?Signed, ?Candee Furbish, MD [facility]  ?03/19/2022 9:50 AM  ? ? ?

## 2022-03-19 NOTE — Assessment & Plan Note (Signed)
-  Had a PTCA to the obtuse marginal branch in 2008 during myocardial infarction.  No stent was placed previously.  Not having any anginal symptoms.  Seems to be doing quite well.  No longer requiring beta-blocker.  Blood pressure has been low in the past.  Doing very well.  No changes.  Continuing with statin.  He did ask about aspirin.  We will continue. ?

## 2022-03-19 NOTE — Patient Instructions (Signed)
Medication Instructions:  ?Your physician recommends that you continue on your current medications as directed. Please refer to the Current Medication list given to you today. ? ?*If you need a refill on your cardiac medications before your next appointment, please call your pharmacy* ? ? ?Lab Work: ?NONE ?If you have labs (blood work) drawn today and your tests are completely normal, you will receive your results only by: ?MyChart Message (if you have MyChart) OR ?A paper copy in the mail ?If you have any lab test that is abnormal or we need to change your treatment, we will call you to review the results. ? ? ?Testing/Procedures: ?NONE ? ? ?Follow-Up: ?At [facility], you and your health needs are our priority.  As part of our continuing mission to provide you with exceptional heart care, we have created designated Provider Care Teams.  These Care Teams include your primary Cardiologist (physician) and Advanced Practice Providers (APPs -  Physician Assistants and Nurse Practitioners) who all work together to provide you with the care you need, when you need it. ? ? ?Your next appointment:   ?2 year(s) ? ?The format for your next appointment:   ?In Person ? ?Provider:   ?Donato Schultz, MD   ? ? ?Important Information About Sugar ? ? ? ? ?  ?

## 2022-03-19 NOTE — Assessment & Plan Note (Signed)
PTCA to the obtuse marginal branch in 2008 during myocardial infarction.  No stent  ?

## 2022-03-19 NOTE — Assessment & Plan Note (Signed)
-  LDL at goal less than 70 at last check.  Continue with current regimen.  No myalgias.  His primary care physician continues to monitor closely.  Doing well. ?

## 2022-07-21 DIAGNOSIS — Z125 Encounter for screening for malignant neoplasm of prostate: Secondary | ICD-10-CM | POA: Diagnosis not present

## 2022-07-21 DIAGNOSIS — E785 Hyperlipidemia, unspecified: Secondary | ICD-10-CM | POA: Diagnosis not present

## 2022-07-21 DIAGNOSIS — Z0000 Encounter for general adult medical examination without abnormal findings: Secondary | ICD-10-CM | POA: Diagnosis not present

## 2023-07-28 DIAGNOSIS — Z125 Encounter for screening for malignant neoplasm of prostate: Secondary | ICD-10-CM | POA: Diagnosis not present

## 2023-07-28 DIAGNOSIS — Z0000 Encounter for general adult medical examination without abnormal findings: Secondary | ICD-10-CM | POA: Diagnosis not present

## 2023-07-28 DIAGNOSIS — E785 Hyperlipidemia, unspecified: Secondary | ICD-10-CM | POA: Diagnosis not present

## 2023-08-19 DIAGNOSIS — E039 Hypothyroidism, unspecified: Secondary | ICD-10-CM | POA: Diagnosis not present

## 2023-08-19 DIAGNOSIS — N289 Disorder of kidney and ureter, unspecified: Secondary | ICD-10-CM | POA: Diagnosis not present

## 2023-10-14 DIAGNOSIS — E039 Hypothyroidism, unspecified: Secondary | ICD-10-CM | POA: Diagnosis not present

## 2024-12-07 ENCOUNTER — Telehealth: Payer: Self-pay | Admitting: Cardiology

## 2024-12-07 DIAGNOSIS — E7800 Pure hypercholesterolemia, unspecified: Secondary | ICD-10-CM

## 2024-12-07 DIAGNOSIS — I2510 Atherosclerotic heart disease of native coronary artery without angina pectoris: Secondary | ICD-10-CM

## 2024-12-07 DIAGNOSIS — I252 Old myocardial infarction: Secondary | ICD-10-CM

## 2024-12-07 NOTE — Telephone Encounter (Signed)
 Patient stated he has moved to [place]  and wants a referral from Dr. Jeffrie sent to [facility], phone# [phone], fax# [phone].

## 2024-12-07 NOTE — Telephone Encounter (Signed)
 Informed patient that referral was put in per request d/t moving to [place].
